# Patient Record
Sex: Male | Born: 1960 | Race: White | Hispanic: No | Marital: Married | Smoking: Current every day smoker
Health system: Southern US, Community
[De-identification: ages and names within clinical notes are randomized; demographics above are authoritative.]

## PROBLEM LIST (undated history)

## (undated) DIAGNOSIS — J449 Chronic obstructive pulmonary disease, unspecified: Secondary | ICD-10-CM

---

## 2011-09-30 ENCOUNTER — Emergency Department (HOSPITAL_COMMUNITY): Payer: BC Managed Care – PPO

## 2011-09-30 ENCOUNTER — Emergency Department (HOSPITAL_COMMUNITY)
Admission: EM | Admit: 2011-09-30 | Discharge: 2011-09-30 | Disposition: A | Discharge status: Home / Self Care | Payer: BC Managed Care – PPO | Attending: Emergency Medicine | Admitting: Emergency Medicine

## 2011-09-30 ENCOUNTER — Encounter (HOSPITAL_COMMUNITY): Payer: Self-pay | Admitting: *Deleted

## 2011-09-30 DIAGNOSIS — M795 Residual foreign body in soft tissue: Secondary | ICD-10-CM | POA: Insufficient documentation

## 2011-09-30 DIAGNOSIS — F17200 Nicotine dependence, unspecified, uncomplicated: Secondary | ICD-10-CM | POA: Insufficient documentation

## 2011-09-30 DIAGNOSIS — J4489 Other specified chronic obstructive pulmonary disease: Secondary | ICD-10-CM | POA: Insufficient documentation

## 2011-09-30 DIAGNOSIS — J449 Chronic obstructive pulmonary disease, unspecified: Secondary | ICD-10-CM | POA: Insufficient documentation

## 2011-09-30 DIAGNOSIS — Z23 Encounter for immunization: Secondary | ICD-10-CM | POA: Insufficient documentation

## 2011-09-30 HISTORY — DX: Chronic obstructive pulmonary disease, unspecified: J44.9

## 2011-09-30 MED ORDER — OXYCODONE-ACETAMINOPHEN 5-325 MG PO TABS: 1.0000 | ORAL_TABLET | Freq: Four times a day (QID) | ORAL | Status: AC | PRN

## 2011-09-30 MED ORDER — OXYCODONE-ACETAMINOPHEN 5-325 MG PO TABS
2.0000 | ORAL_TABLET | ORAL | Status: AC
Start: 2011-09-30 — End: 2011-09-30
  Administered 2011-09-30: 2 via ORAL

## 2011-09-30 MED ORDER — TETANUS-DIPHTH-ACELL PERTUSSIS 5-2.5-18.5 LF-MCG/0.5 IM SUSP
0.5000 mL | Freq: Once | INTRAMUSCULAR | Status: AC
  Administered 2011-09-30: 0.5 mL via INTRAMUSCULAR
  Filled 2011-09-30: qty 0.5

## 2011-09-30 MED ORDER — OXYCODONE HCL 5 MG PO TABS: 5.0000 mg | ORAL_TABLET | ORAL | Status: DC

## 2011-09-30 MED ORDER — CEPHALEXIN 500 MG PO CAPS: 500.0000 mg | ORAL_CAPSULE | Freq: Three times a day (TID) | ORAL | Status: AC

## 2011-09-30 MED ORDER — OXYCODONE-ACETAMINOPHEN 5-325 MG PO TABS
ORAL_TABLET | ORAL | Status: AC
  Filled 2011-09-30: qty 2

## 2011-09-30 NOTE — ED Notes (Addendum)
Pt injured hand with nail gun this evening.  Nail in left hand near thumb.  No other injuries.  Pulses, sensation intact.  Good color.

## 2011-09-30 NOTE — ED Provider Notes (Signed)
History     CSN: [id number]  Arrival date & time 09/30/11  2033   First MD Initiated Contact with Patient 09/30/11 2134      Chief Complaint  Patient presents with  . Foreign Body  . Hand Injury    (Consider location/radiation/quality/duration/timing/severity/associated sxs/prior treatment) HPI Comments: Patient presents emergency department with chief complaint of foreign body in hand.  Patient states he was putting a porch together when the nail went through his hand inside of the width.  Patient's tetanus status is unknown.  Pain is 10/10.  He denies any decreased sensation.  No other complaints at this time.  Patient is a 51 y.o. male presenting with foreign body and hand injury. The history is provided by the patient.  Foreign Body   Hand Injury     Past Medical History  Diagnosis Date  . COPD (chronic obstructive pulmonary disease)     History reviewed. No pertinent past surgical history.  History reviewed. No pertinent family history.  History  Substance Use Topics  . Smoking status: Current Everyday Smoker -- 2.0 packs/day  . Smokeless tobacco: Not on file  . Alcohol Use: Yes      Review of Systems  All other systems reviewed and are negative.    Allergies  Other  Home Medications  No current outpatient prescriptions on file.  BP 131/94  Pulse 102  Temp 98.1 F (36.7 C)  Resp 16  SpO2 97%  Physical Exam  Nursing note and vitals reviewed. Constitutional: He is oriented to person, place, and time. He appears well-developed and well-nourished. No distress.  HENT:  Head: Normocephalic and atraumatic.  Eyes: Conjunctivae and EOM are normal.  Neck: Normal range of motion.  Pulmonary/Chest: Effort normal.  Musculoskeletal: Normal range of motion.       Normal range of motion of digits and MCP.  Neurological: He is alert and oriented to person, place, and time.       Intact sensation  Skin: Skin is warm and dry. No rash noted. He is not  diaphoretic.       Large nail through the thenar prominence of left hand.  No active bleeding.  Psychiatric: He has a normal mood and affect. His behavior is normal.    ED Course  FOREIGN BODY REMOVAL Date/Time: 09/30/2011 11:19 PM Performed by: Jaci Carrel Authorized by: Jaci Carrel Consent: Verbal consent obtained. Written consent not obtained. Risks and benefits: risks, benefits and alternatives were discussed Consent given by: patient Patient understanding: patient states understanding of the procedure being performed Patient consent: the patient's understanding of the procedure matches consent given Imaging studies: imaging studies available Patient identity confirmed: verbally with patient Time out: Immediately prior to procedure a "time out" was called to verify the correct patient, procedure, equipment, support staff and site/side marked as required. Intake: Left thenar prominence. Anesthesia: local infiltration Local anesthetic: lidocaine 2% without epinephrine Anesthetic total: 2 ml Patient sedated: no Patient restrained: no Complexity: simple 1 objects recovered. Objects recovered: Clean nail Post-procedure assessment: foreign body removed Patient tolerance: Patient tolerated the procedure well with no immediate complications. Comments: Significant pressure eructation performed.  No evidence of remaining foreign body.   (including critical care time)  Labs Reviewed - No data to display Dg Hand Complete Left  09/30/2011  *RADIOLOGY REPORT*  Clinical Data: Nail gun injury to the left hand with retained nail in the soft tissues.  LEFT HAND - COMPLETE 3+ VIEW  Comparison: None.  Findings: A nail extends into  the hand from the volar surface in between the first and second metacarpals.  There is no evidence of bony fracture or dislocation.  The nail appears intact.  No soft tissue air identified.  IMPRESSION: Nail extending into the hand in between first and second  metacarpals.  No fracture of bone is identified.  Original Report Authenticated By: Reola Calkins, M.D.     No diagnosis found.    MDM  Foreign body removal  Patient procedure well.  Patient will be discharged on prophylactic antibiotic Keflex with repetition to followup with PCP in 3-5 days.  Conservative home medications of cleaning wound discussed.  Questions answered.  Strict return precautions discussed as well.  Patient will be discharged on pain medication and is agreeable with plan.        Jaci Carrel, [place] 09/30/11 2321

## 2011-10-02 NOTE — ED Provider Notes (Signed)
Medical screening examination/treatment/procedure(s) were conducted as a shared visit with non-physician practitioner(s) and myself.  I personally evaluated the patient during the encounter 51 yo man accidentally shot himself in the left thenar eminence with a nail gun.  Exam shows the nail protruding from the left thenar eminence, no loss of sensation or tendon function.  Will need local anesthesia, have nail pulled out, take Keflex to try to prevent infection.  Carleene Cooper III, MD 10/02/11 [phone]

## 2020-06-08 ENCOUNTER — Emergency Department (HOSPITAL_COMMUNITY): Payer: BC Managed Care – PPO

## 2020-06-08 ENCOUNTER — Emergency Department (HOSPITAL_COMMUNITY)
Admission: EM | Admit: 2020-06-08 | Discharge: 2020-06-09 | Disposition: A | Discharge status: Home / Self Care | Payer: BC Managed Care – PPO | Attending: Emergency Medicine | Admitting: Emergency Medicine

## 2020-06-08 ENCOUNTER — Other Ambulatory Visit: Payer: Self-pay

## 2020-06-08 ENCOUNTER — Encounter (HOSPITAL_COMMUNITY): Payer: Self-pay | Admitting: Pharmacy Technician

## 2020-06-08 DIAGNOSIS — R4182 Altered mental status, unspecified: Secondary | ICD-10-CM | POA: Insufficient documentation

## 2020-06-08 DIAGNOSIS — R410 Disorientation, unspecified: Secondary | ICD-10-CM

## 2020-06-08 DIAGNOSIS — J449 Chronic obstructive pulmonary disease, unspecified: Secondary | ICD-10-CM | POA: Insufficient documentation

## 2020-06-08 DIAGNOSIS — F17200 Nicotine dependence, unspecified, uncomplicated: Secondary | ICD-10-CM | POA: Insufficient documentation

## 2020-06-08 DIAGNOSIS — R519 Headache, unspecified: Secondary | ICD-10-CM | POA: Diagnosis not present

## 2020-06-08 DIAGNOSIS — R059 Cough, unspecified: Secondary | ICD-10-CM

## 2020-06-08 LAB — COMPREHENSIVE METABOLIC PANEL
ALT: 56 U/L — ABNORMAL HIGH (ref 0–44)
AST: 29 U/L (ref 15–41)
Albumin: 4.4 g/dL (ref 3.5–5.0)
Alkaline Phosphatase: 58 U/L (ref 38–126)
Anion gap: 9 (ref 5–15)
BUN: 15 mg/dL (ref 6–20)
CO2: 24 mmol/L (ref 22–32)
Calcium: 9.7 mg/dL (ref 8.9–10.3)
Chloride: 102 mmol/L (ref 98–111)
Creatinine, Ser: 1.31 mg/dL — ABNORMAL HIGH (ref 0.61–1.24)
GFR, Estimated: >60 mL/min (ref >60)
Glucose, Bld: 95 mg/dL (ref 70–99)
Potassium: 4.2 mmol/L (ref 3.5–5.1)
Sodium: 135 mmol/L (ref 135–145)
Total Bilirubin: 1.1 mg/dL (ref 0.3–1.2)
Total Protein: 7.3 g/dL (ref 6.5–8.1)

## 2020-06-08 LAB — CBC
HCT: 50.2 % (ref 39.0–52.0)
Hemoglobin: 16.5 g/dL (ref 13.0–17.0)
MCH: 30 pg (ref 26.0–34.0)
MCHC: 32.9 g/dL (ref 30.0–36.0)
MCV: 91.3 fL (ref 80.0–100.0)
Platelets: 271 10*3/uL (ref 150–400)
RBC: 5.5 MIL/uL (ref 4.22–5.81)
RDW: 12.9 % (ref 11.5–15.5)
WBC: 9.5 10*3/uL (ref 4.0–10.5)
nRBC: 0 % (ref 0.0–0.2)

## 2020-06-08 LAB — URINALYSIS, ROUTINE W REFLEX MICROSCOPIC
Bilirubin Urine: NEGATIVE
Glucose, UA: NEGATIVE mg/dL
Hgb urine dipstick: NEGATIVE
Ketones, ur: 5 mg/dL — AB
Leukocytes,Ua: NEGATIVE
Nitrite: NEGATIVE
Protein, ur: NEGATIVE mg/dL
Specific Gravity, Urine: 1.01 (ref 1.005–1.030)
pH: 5 (ref 5.0–8.0)

## 2020-06-08 LAB — AMMONIA: Ammonia: 12 umol/L (ref 9–35)

## 2020-06-08 LAB — LACTIC ACID, PLASMA: Lactic Acid, Venous: 1.1 mmol/L (ref 0.5–1.9)

## 2020-06-08 MED ORDER — LORAZEPAM 2 MG/ML IJ SOLN: 1.0000 mg | Freq: Once | INTRAMUSCULAR | Status: DC | PRN

## 2020-06-08 MED ORDER — ACETAMINOPHEN 325 MG PO TABS
650.0000 mg | ORAL_TABLET | Freq: Once | ORAL | Status: AC
  Administered 2020-06-08: 650 mg via ORAL
  Filled 2020-06-08: qty 2

## 2020-06-08 MED ORDER — SODIUM CHLORIDE 0.9 % IV BOLUS
1000.0000 mL | Freq: Once | INTRAVENOUS | Status: AC
  Administered 2020-06-08: 1000 mL via INTRAVENOUS

## 2020-06-08 NOTE — ED Provider Notes (Signed)
[facility] EMERGENCY DEPARTMENT Provider Note   CSN: [id number] Arrival date & time: 06/08/20  1813     History Chief Complaint  Patient presents with  . Altered Mental Status    Roger Mahoney is a 60 y.o. male.  Who presents emergency department for altered mental status.  Patient is here with his wife who supplements the history.  The patient has had a very bad cold for the past 2 weeks.  He was seen in the outpatient setting.  He was not tested for Covid.  He was started on amoxicillin, Tussionex suspension and prednisone.  And ended his a fall 2 days ago.  Yesterday the patient had an episode where he was confused and had difficulty getting his words out.  He states that he also had abnormal vision and spots before his eyes.  This seemed to resolve but apparently very frightened his daughters who are both nurses a lot.  Today the patient came home and when he entered the house was unable to recognize close family members who live in the house.  His wife states that he was extremely confused and kept asking questions repetitively.  He was not responding to commands appropriately and he also was complaining that he could not see well.  She states that he did not know who his granddaughter words and that the granddaughter lives with them since she was a baby.  He did not seem to have difficulty getting his words out but could not seem to understand what she was saying.  She brought him over immediately for assessment.  Since that time he has returned to baseline.  Upon arrival the nurse was putting on EKG leads and found a tick in his right axilla which she removed.  Patient does have a slight headache which he attributes to coughing.  He denies fevers, chills, urinary symptoms.  He does not see a doctor regularly.  He denies history of hypertension or high cholesterol.  He does use snuff but does not smoke for the past 6 years.  HPI     Past Medical History:  Diagnosis Date   . COPD (chronic obstructive pulmonary disease) (HCC)     There are no problems to display for this patient.   No past surgical history on file.     No family history on file.  Social History   Tobacco Use  . Smoking status: Current Every Day Smoker    Packs/day: 2.00  Substance Use Topics  . Alcohol use: Yes  . Drug use: No    Home Medications Prior to Admission medications   Not on File    Allergies    Other  Review of Systems   Review of Systems Ten systems reviewed and are negative for acute change, except as noted in the HPI.   Physical Exam Updated Vital Signs BP (!) 131/105 (BP Location: Left Arm)   Pulse 91   Temp 98.4 F (36.9 C) (Oral)   Resp 19   SpO2 99%   Physical Exam Vitals and nursing note reviewed.  Constitutional:      General: He is not in acute distress.    Appearance: He is well-developed. He is not diaphoretic.  HENT:     Head: Normocephalic and atraumatic.     Ears:     Comments: Bilateral cerumen impactions Eyes:     General: No scleral icterus.    Conjunctiva/sclera: Conjunctivae normal.  Cardiovascular:     Rate and Rhythm: Normal  rate and regular rhythm.     Heart sounds: Normal heart sounds.  Pulmonary:     Effort: Pulmonary effort is normal. No respiratory distress.     Breath sounds: Rhonchi present.     Comments: Rhonchi do not clear with cough Abdominal:     Palpations: Abdomen is soft.     Tenderness: There is no abdominal tenderness.  Musculoskeletal:     Cervical back: Normal range of motion and neck supple.  Skin:    General: Skin is warm and dry.  Neurological:     Mental Status: He is alert.     Comments: Speech is clear and goal oriented, follows commands Major Cranial nerves without deficit, no facial droop Normal strength in upper and lower extremities bilaterally including dorsiflexion and plantar flexion, strong and equal grip strength Sensation normal to light and sharp touch Moves extremities  without ataxia, coordination intact Normal finger to nose and rapid alternating movements Neg romberg, no pronator drift Normal gait Normal heel-shin and balance  Psychiatric:        Behavior: Behavior normal.     ED Results / Procedures / Treatments   Labs (all labs ordered are listed, but only abnormal results are displayed) Labs Reviewed  COMPREHENSIVE METABOLIC PANEL - Abnormal; Notable for the following components:      Result Value   Creatinine, Ser 1.31 (*)    ALT 56 (*)    All other components within normal limits  CBC  URINALYSIS, ROUTINE W REFLEX MICROSCOPIC  AMMONIA  LACTIC ACID, PLASMA  CBG MONITORING, ED    EKG EKG Interpretation  Date/Time:  Thursday June 08 2020 18:18:01 EDT Ventricular Rate:  98 PR Interval:  176 QRS Duration: 70 QT Interval:  338 QTC Calculation: 431 R Axis:   51 Text Interpretation: Normal sinus rhythm Cannot rule out Anterior infarct , age undetermined Abnormal ECG No previous ECGs available Confirmed by Alvira Monday ([id number]) on 06/08/2020 9:22:23 PM   Radiology No results found.  Procedures Procedures  Medications Ordered in ED Medications  sodium chloride 0.9 % bolus 1,000 mL (has no administration in time range)  acetaminophen (TYLENOL) tablet 650 mg (has no administration in time range)    ED Course  I have reviewed the triage vital signs and the nursing notes.  Pertinent labs & imaging results that were available during my care of the patient were reviewed by me and considered in my medical decision making (see chart for details).    MDM Rules/Calculators/A&P                         60 year old male here with altered mental status The differential diagnosis for AMS is extensive and includes, but is not limited to: drug overdose - opioids, alcohol, sedatives, antipsychotics, drug withdrawal, others; Metabolic: hypoxia, hypoglycemia, hyperglycemia, hypercalcemia, hypernatremia, hyponatremia, uremia, hepatic  encephalopathy, hypothyroidism, hyperthyroidism, vitamin B12 or thiamine deficiency, carbon monoxide poisoning, Wilson's disease, Lactic acidosis, DKA/HHOS; Infectious: meningitis, encephalitis, bacteremia/sepsis, urinary tract infection, pneumonia, neurosyphilis; Structural: Space-occupying lesion, (brain tumor, subdural hematoma, hydrocephalus,); Vascular: stroke, subarachnoid hemorrhage, coronary ischemia, hypertensive encephalopathy, CNS vasculitis, thrombotic thrombocytopenic purpura, disseminated intravascular coagulation, hyperviscosity; Psychiatric: Schizophrenia, depression; Other: Seizure, hypothermia, heat stroke, ICU psychosis, dementia -"sundowning." I ordered and reviewed labs which include CBC without abnormality, CMP with slightly elevated creatinine at 1.3.  Lactic acid and ammonia within normal limits, TSH UA pending  2 view chest and CT head without abnormality.  Case discussed with Dr. Ellyn Hack recommend  CT angio head and neck as well as MR brain.  If negative this may be a side effect of the recent prednisone use.  Signout given to PA [facility] who will assume care of the patient   Final Clinical Impression(s) / ED Diagnoses Final diagnoses:  None    Rx / DC Orders ED Discharge Orders    None       Arthor Captain, PA-C 06/08/20 2340    Alvira Monday, MD 06/09/20 1212

## 2020-06-08 NOTE — ED Notes (Signed)
Pt to mri 

## 2020-06-08 NOTE — ED Triage Notes (Addendum)
Pt here with family with reports of ams onset yesterday. Per spouse, pt did not recognize his grandchildren last night. Spouse states when they went to bed he seemed to be fine. Today pt was confused and could not answer questions for his wife. Pt recently treated for URI. Finished abx on Tuesday.  No arm drift, no facial droop noted.

## 2020-06-08 NOTE — ED Notes (Signed)
Patient transported to CT 

## 2020-06-08 NOTE — ED Notes (Signed)
Pt wife notified this NT that pt discovered tick under right armpit after changing into pt gown. Pt advised not to mess with tick until providers could evaluate. RN notified.

## 2020-06-09 ENCOUNTER — Emergency Department (HOSPITAL_COMMUNITY): Payer: BC Managed Care – PPO

## 2020-06-09 LAB — CBG MONITORING, ED: Glucose-Capillary: 97 mg/dL (ref 70–99)

## 2020-06-09 MED ORDER — FLUTICASONE PROPIONATE 50 MCG/ACT NA SUSP: 2.0000 | Freq: Every day | NASAL | 0 refills | Status: AC

## 2020-06-09 MED ORDER — IOHEXOL 350 MG/ML SOLN
80.0000 mL | Freq: Once | INTRAVENOUS | Status: AC | PRN
  Administered 2020-06-09: 80 mL via INTRAVENOUS

## 2020-06-09 NOTE — ED Notes (Signed)
Pa browning at bedside

## 2020-06-09 NOTE — ED Provider Notes (Signed)
Patient signed out to me at shift change.  Had some episodes of confusion this week.    CT head and neck and MRI without acute intracranial findings.  Prior provider discussed case with neurology, who questioned steroid induced psychosis.  Patient's symptoms have now resolved.  Based on reassuring imaging and workup here, I will release from the ED and recommend close follow-up with PCP.  Return precautions discussed.  Discontinue steroids.   Roxy Horseman, PA-C 06/09/20 0329    Marily Memos, MD 06/09/20 [phone]

## 2020-06-09 NOTE — Discharge Instructions (Signed)
Your emergency department evaluation shows no sign of stroke.  It's possible that your symptoms of confusion are from the steroid that you had been taking.  Please discontinue this.  Please continue your Zyrtec and mucinex.  I'd suggest adding flonase and nasal saline.  If not improving in the next week, you should see an allergist or pulmonologist.

## 2021-11-24 IMAGING — CT CT ANGIO NECK
2 of 7 series · 8 of 33 positions shown · IV contrast (omnipaque)
Comparison: None.

CLINICAL DATA: Encephalopathy



[Series 5: cta neck · axial · 0.51mm/px · z∈[-176,-48]mm · 2 of 193 slices shown]
[im 65/193  soft-tissue]
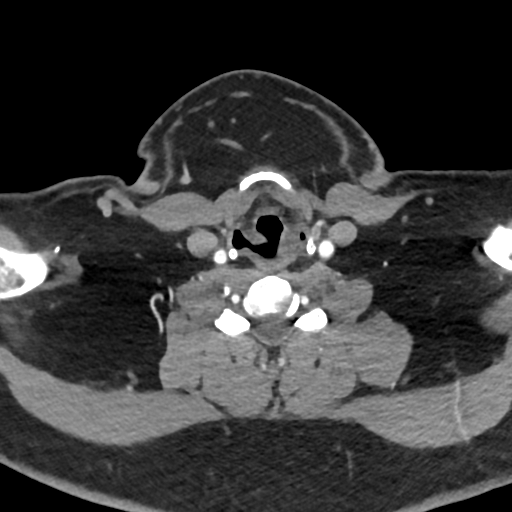
[im 129/193  soft-tissue]
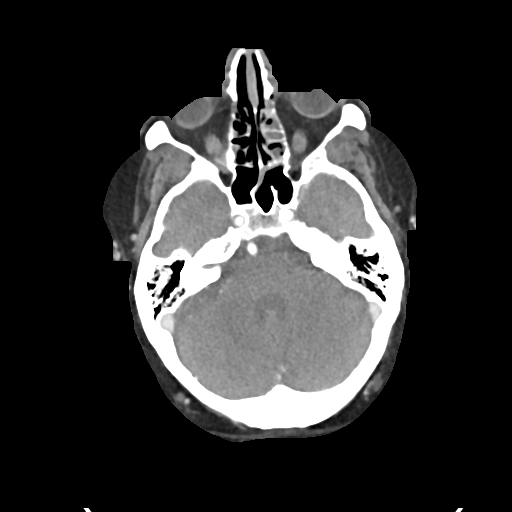

[Series 7: cta neck axial · axial · 0.49mm/px · z∈[-250,+24]mm · 6 of 384 slices shown]
[im 55/384  soft-tissue]
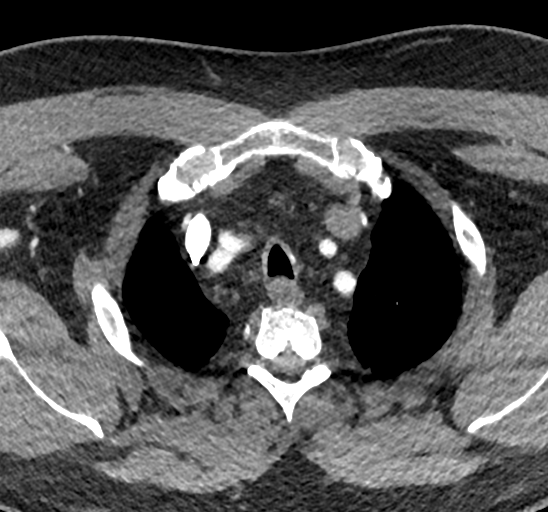
[im 110/384  bone]
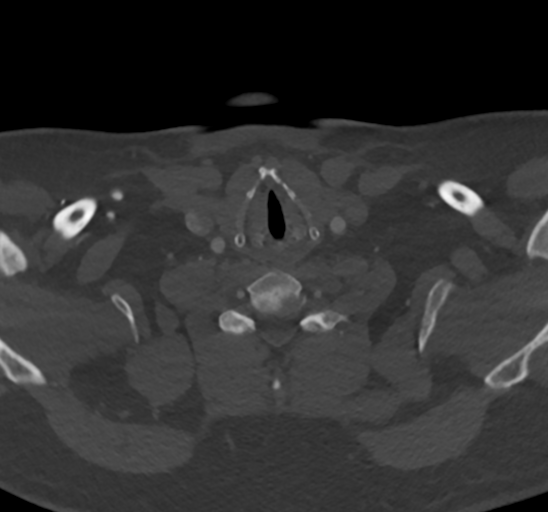
[im 165/384  soft-tissue]
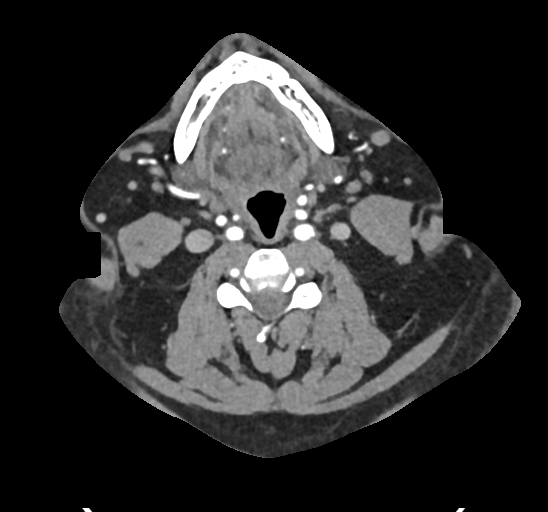
[im 219/384  bone]
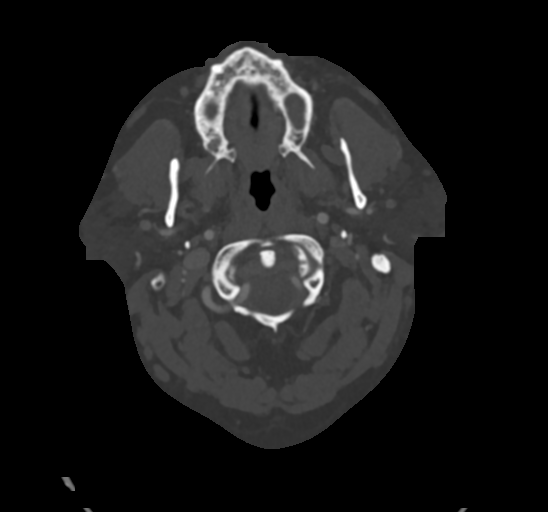
[im 274/384  soft-tissue]
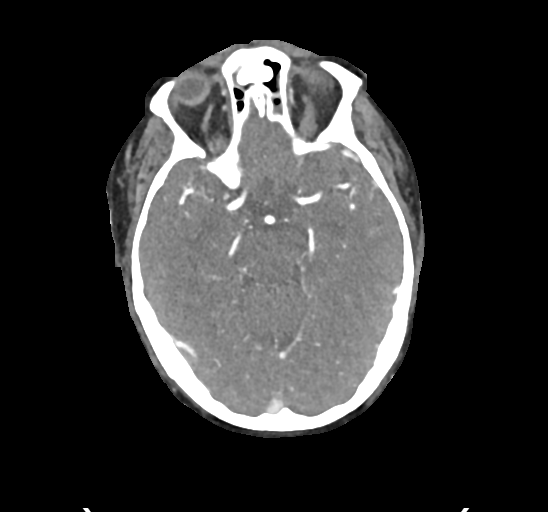
[im 329/384  bone]
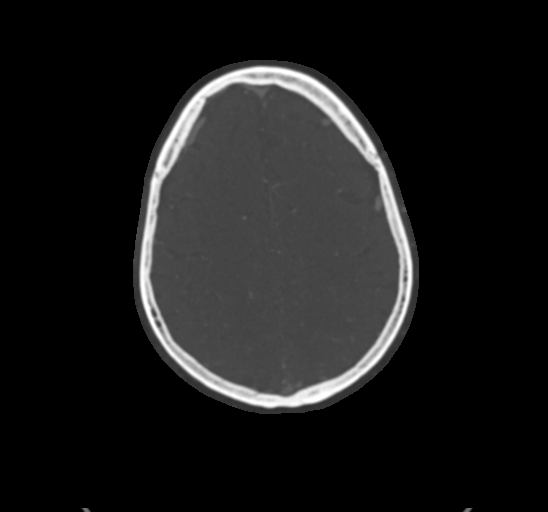

[8 of 33 positions shown; findings below may reference images not displayed]

FINDINGS: CTA NECK FINDINGS

SKELETON: There is no bony spinal canal stenosis. No lytic or
blastic lesion.

OTHER NECK: Normal pharynx, larynx and major salivary glands. No
cervical lymphadenopathy. Unremarkable thyroid gland.

UPPER CHEST: No pneumothorax or pleural effusion. No nodules or
masses.

AORTIC ARCH:

There is no calcific atherosclerosis of the aortic arch. There is no
aneurysm, dissection or hemodynamically significant stenosis of the
visualized portion of the aorta. Conventional 3 vessel aortic
branching pattern. The visualized proximal subclavian arteries are
widely patent.

RIGHT CAROTID SYSTEM: Normal without aneurysm, dissection or
stenosis.

LEFT CAROTID SYSTEM: Normal without aneurysm, dissection or
stenosis.

VERTEBRAL ARTERIES: Left dominant configuration. Both origins are
clearly patent. There is no dissection, occlusion or flow-limiting
stenosis to the skull base (V1-V3 segments).

CTA HEAD FINDINGS

POSTERIOR CIRCULATION:

--Vertebral arteries: Normal V4 segments.

--Inferior cerebellar arteries: Normal.

--Basilar artery: Normal.

--Superior cerebellar arteries: Normal.

--Posterior cerebral arteries (PCA): Normal.

ANTERIOR CIRCULATION:

--Intracranial internal carotid arteries: Normal.

--Anterior cerebral arteries (ACA): Normal. Both A1 segments are
present. Patent anterior communicating artery (a-comm).

--Middle cerebral arteries (MCA): Normal.

VENOUS SINUSES: As permitted by contrast timing, patent.

ANATOMIC VARIANTS: None

Review of the MIP images confirms the above findings.
IMPRESSION: Normal CTA of the head and neck.

## 2023-02-23 ENCOUNTER — Telehealth: Payer: Self-pay | Admitting: Physician Assistant

## 2023-02-23 MED ORDER — PEG 3350-KCL-NA BICARB-NACL 420 G PO SOLR: 4000.0000 mL | Freq: Once | ORAL | 0 refills | Status: AC

## 2023-02-23 NOTE — Telephone Encounter (Signed)
Telephone Call 02/23/2023 10:30 AM  Patient called on-call service over the weekend.  Apparently his GoLytely prep was never received by the pharmacy when he went to pick it up today.  He does have a colonoscopy scheduled for 830 tomorrow morning.  I went ahead and resent a GoLytely prep to the pharmacy, he confirmed [facility] at [address] and [address].  Ensured that he does have instructions on how to use this.  He read them to me over the phone.  Hyacinth Meeker, PA-C

## 2023-10-01 ENCOUNTER — Encounter (HOSPITAL_BASED_OUTPATIENT_CLINIC_OR_DEPARTMENT_OTHER): Payer: Self-pay
